# Patient Record
Sex: Male | Born: 1965 | Race: White | Hispanic: No | Smoking: Current every day smoker
Health system: Southern US, Community
[De-identification: ages and names within clinical notes are randomized; demographics above are authoritative.]

## PROBLEM LIST (undated history)

## (undated) HISTORY — PX: KNEE SURGERY: SHX244

---

## 2013-09-04 ENCOUNTER — Emergency Department (HOSPITAL_COMMUNITY): Payer: Medicaid - Out of State

## 2013-09-04 ENCOUNTER — Encounter (HOSPITAL_COMMUNITY): Payer: Self-pay | Admitting: Emergency Medicine

## 2013-09-04 ENCOUNTER — Emergency Department (HOSPITAL_COMMUNITY)
Admission: EM | Admit: 2013-09-04 | Discharge: 2013-09-04 | Discharge status: Against Medical Advice | Payer: Medicaid - Out of State | Attending: Emergency Medicine | Admitting: Emergency Medicine

## 2013-09-04 DIAGNOSIS — J40 Bronchitis, not specified as acute or chronic: Secondary | ICD-10-CM

## 2013-09-04 DIAGNOSIS — Z79899 Other long term (current) drug therapy: Secondary | ICD-10-CM | POA: Insufficient documentation

## 2013-09-04 DIAGNOSIS — F17200 Nicotine dependence, unspecified, uncomplicated: Secondary | ICD-10-CM | POA: Insufficient documentation

## 2013-09-04 DIAGNOSIS — J209 Acute bronchitis, unspecified: Secondary | ICD-10-CM | POA: Insufficient documentation

## 2013-09-04 DIAGNOSIS — R6883 Chills (without fever): Secondary | ICD-10-CM | POA: Insufficient documentation

## 2013-09-04 DIAGNOSIS — Z880 Allergy status to penicillin: Secondary | ICD-10-CM | POA: Insufficient documentation

## 2013-09-04 MED ORDER — DOXYCYCLINE HYCLATE 100 MG PO CAPS: 100.0000 mg | ORAL_CAPSULE | Freq: Two times a day (BID) | ORAL | Status: AC

## 2013-09-04 MED ORDER — ACETAMINOPHEN 500 MG PO TABS
1000.0000 mg | ORAL_TABLET | Freq: Once | ORAL | Status: AC
  Administered 2013-09-04: 1000 mg via ORAL
  Filled 2013-09-04: qty 2

## 2013-09-04 NOTE — ED Notes (Signed)
Patient questioning the wait and getting upset; updated patient on plan of care and that MD would be at bedside shortly.

## 2013-09-04 NOTE — ED Notes (Signed)
Patient very upset with his wait and states "i cannot wait much longer". Calmed and comforted patient and apologized for the delay. Dr. Hendricks MiloWoffard aware.

## 2013-09-04 NOTE — ED Notes (Signed)
Patient approached the nurses station yelling at staff stating "where's the front door, i'm not staying any longer." Patient stormed off before I could get patient to sign AMA. Dr. Hendricks MiloWoffard notified.

## 2013-09-04 NOTE — ED Notes (Signed)
Pt states cough, congestion and headache.  Started 2 weeks ago and patient took remaining amount of wife's antibiotics.  Pt felt better but infection came back.  Pt is in shelter and leaves each day outside until return.

## 2013-09-04 NOTE — ED Provider Notes (Signed)
CSN: [id number]     Arrival date & time 09/04/13  [id number] History   First MD Initiated Contact with Patient 09/04/13 1116     Chief Complaint  Patient presents with  . Cough  . Headache     (Consider location/radiation/quality/duration/timing/severity/associated sxs/prior Treatment) Patient is a 48 y.o. male presenting with cough.  Cough Cough characteristics:  Productive Sputum characteristics:  Yellow Severity:  Moderate Onset quality:  Gradual Duration:  2 weeks Timing:  Constant Progression:  Worsening Smoker: yes   Context: sick contacts (wife) and upper respiratory infection   Relieved by:  Nothing Ineffective treatments: "I took the rest of my wife's antibiotics last week" - does not know name of abx. Associated symptoms: chills, headaches, shortness of breath and sinus congestion   Associated symptoms: no fever   Associated symptoms comment:  No night sweats, no weight loss Headaches:    Severity:  Moderate (frontal)   Onset quality:  Gradual   Duration:  2 days   Timing:  Constant   Progression:  Unchanged   History reviewed. No pertinent past medical history. Past Surgical History  Procedure Laterality Date  . Knee surgery     History reviewed. No pertinent family history. History  Substance Use Topics  . Smoking status: Current Every Day Smoker -- 1.00 packs/day    Types: Cigarettes  . Smokeless tobacco: Not on file  . Alcohol Use: No    Review of Systems  Constitutional: Positive for chills. Negative for fever.  Respiratory: Positive for cough and shortness of breath.   Neurological: Positive for headaches.  All other systems reviewed and are negative.      Allergies  Penicillins  Home Medications   Current Outpatient Rx  Name  Route  Sig  Dispense  Refill  . Citalopram Hydrobromide (CELEXA PO)   Oral   Take 1 tablet by mouth daily.          BP 115/65  Pulse 98  Temp(Src) 98.7 F (37.1 C) (Oral)  Resp 18  SpO2 94% Physical Exam   Nursing note and vitals reviewed. Constitutional: He is oriented to person, place, and time. He appears well-developed and well-nourished. No distress.  HENT:  Head: Normocephalic and atraumatic.  Nose: Right sinus exhibits maxillary sinus tenderness and frontal sinus tenderness. Left sinus exhibits maxillary sinus tenderness and frontal sinus tenderness.  Mouth/Throat: Oropharynx is clear and moist.  Eyes: Conjunctivae are normal. Pupils are equal, round, and reactive to light. No scleral icterus.  Neck: Neck supple.  Cardiovascular: Normal rate, regular rhythm, normal heart sounds and intact distal pulses.   No murmur heard. Pulmonary/Chest: Effort normal and breath sounds normal. No stridor. No respiratory distress. He has no wheezes. He has no rales.  Abdominal: Soft. He exhibits no distension. There is no tenderness.  Musculoskeletal: Normal range of motion. He exhibits no edema.  Neurological: He is alert and oriented to person, place, and time.  Skin: Skin is warm and dry. No rash noted.  Psychiatric: He has a normal mood and affect. His behavior is normal.    ED Course  Procedures (including critical care time) Labs Review Labs Reviewed - No data to display Imaging Review Dg Chest 2 View  09/04/2013   CLINICAL DATA:  Cough and shortness of breath. Fever. Smoking history.  EXAM: CHEST  2 VIEW  COMPARISON:  None.  FINDINGS: Generous lung volumes, likely air trapping. There is mild bronchitic changes without edema or consolidation. No effusion or pneumothorax. Normal heart size.  IMPRESSION: Bronchitic changes without edema or consolidation.   Electronically Signed   By: Tiburcio Pea M.D.   On: 09/04/2013 10:50  All radiology studies independently viewed by me.     EKG Interpretation   None       MDM   Final diagnoses:  Bronchitis    48 yo male with URI symptoms with cough for past 2 weeks.  He also complains of headache, which sounds consistent with sinus headache.   No meningeal signs and pt afebrile.  CXR negative for pneumonia.  He was ambulated with normal respiratory effort and O2 sats.  Planned to treat patient with abx for bronchitis, but pt eloped from ED prior to receiving prescriptions.  I tried to call pt, but number provided did not go through on multiple attempts.  Will print prescription and leave for patient in case he returns for prescription.    Candyce Churn III, MD 09/04/13 [phone]

## 2013-09-04 NOTE — ED Notes (Signed)
Patient refusing to put on patient gown for MD examination.

## 2013-09-04 NOTE — Discharge Instructions (Signed)

## 2015-07-02 IMAGING — CR DG CHEST 2V
2 series · 2 of 2 positions shown · non-contrast
Comparison: None.

CLINICAL DATA: Cough and shortness of breath. Fever. Smoking
history.

EXAM:
CHEST  2 VIEW

[w chest pa]
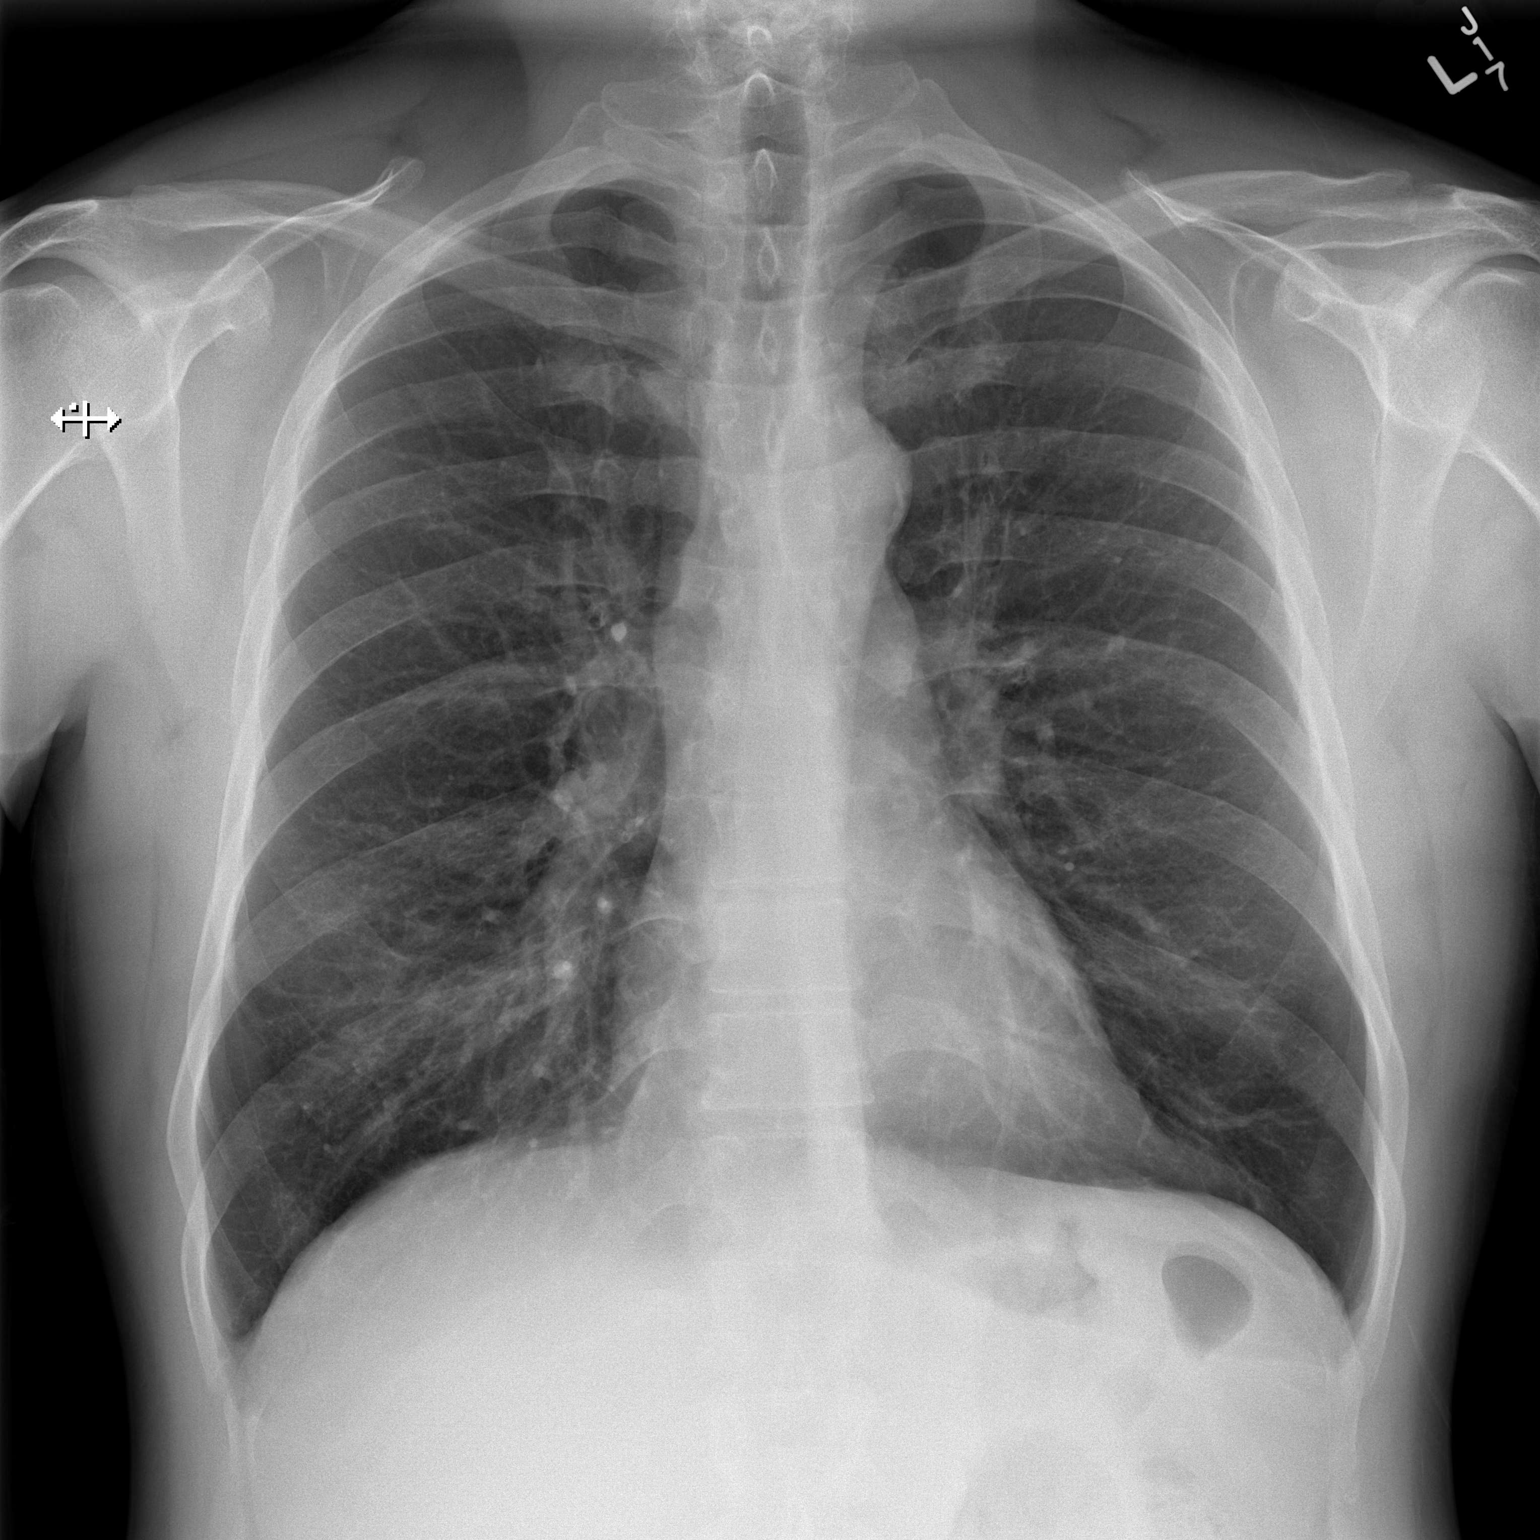

[w chest lat]
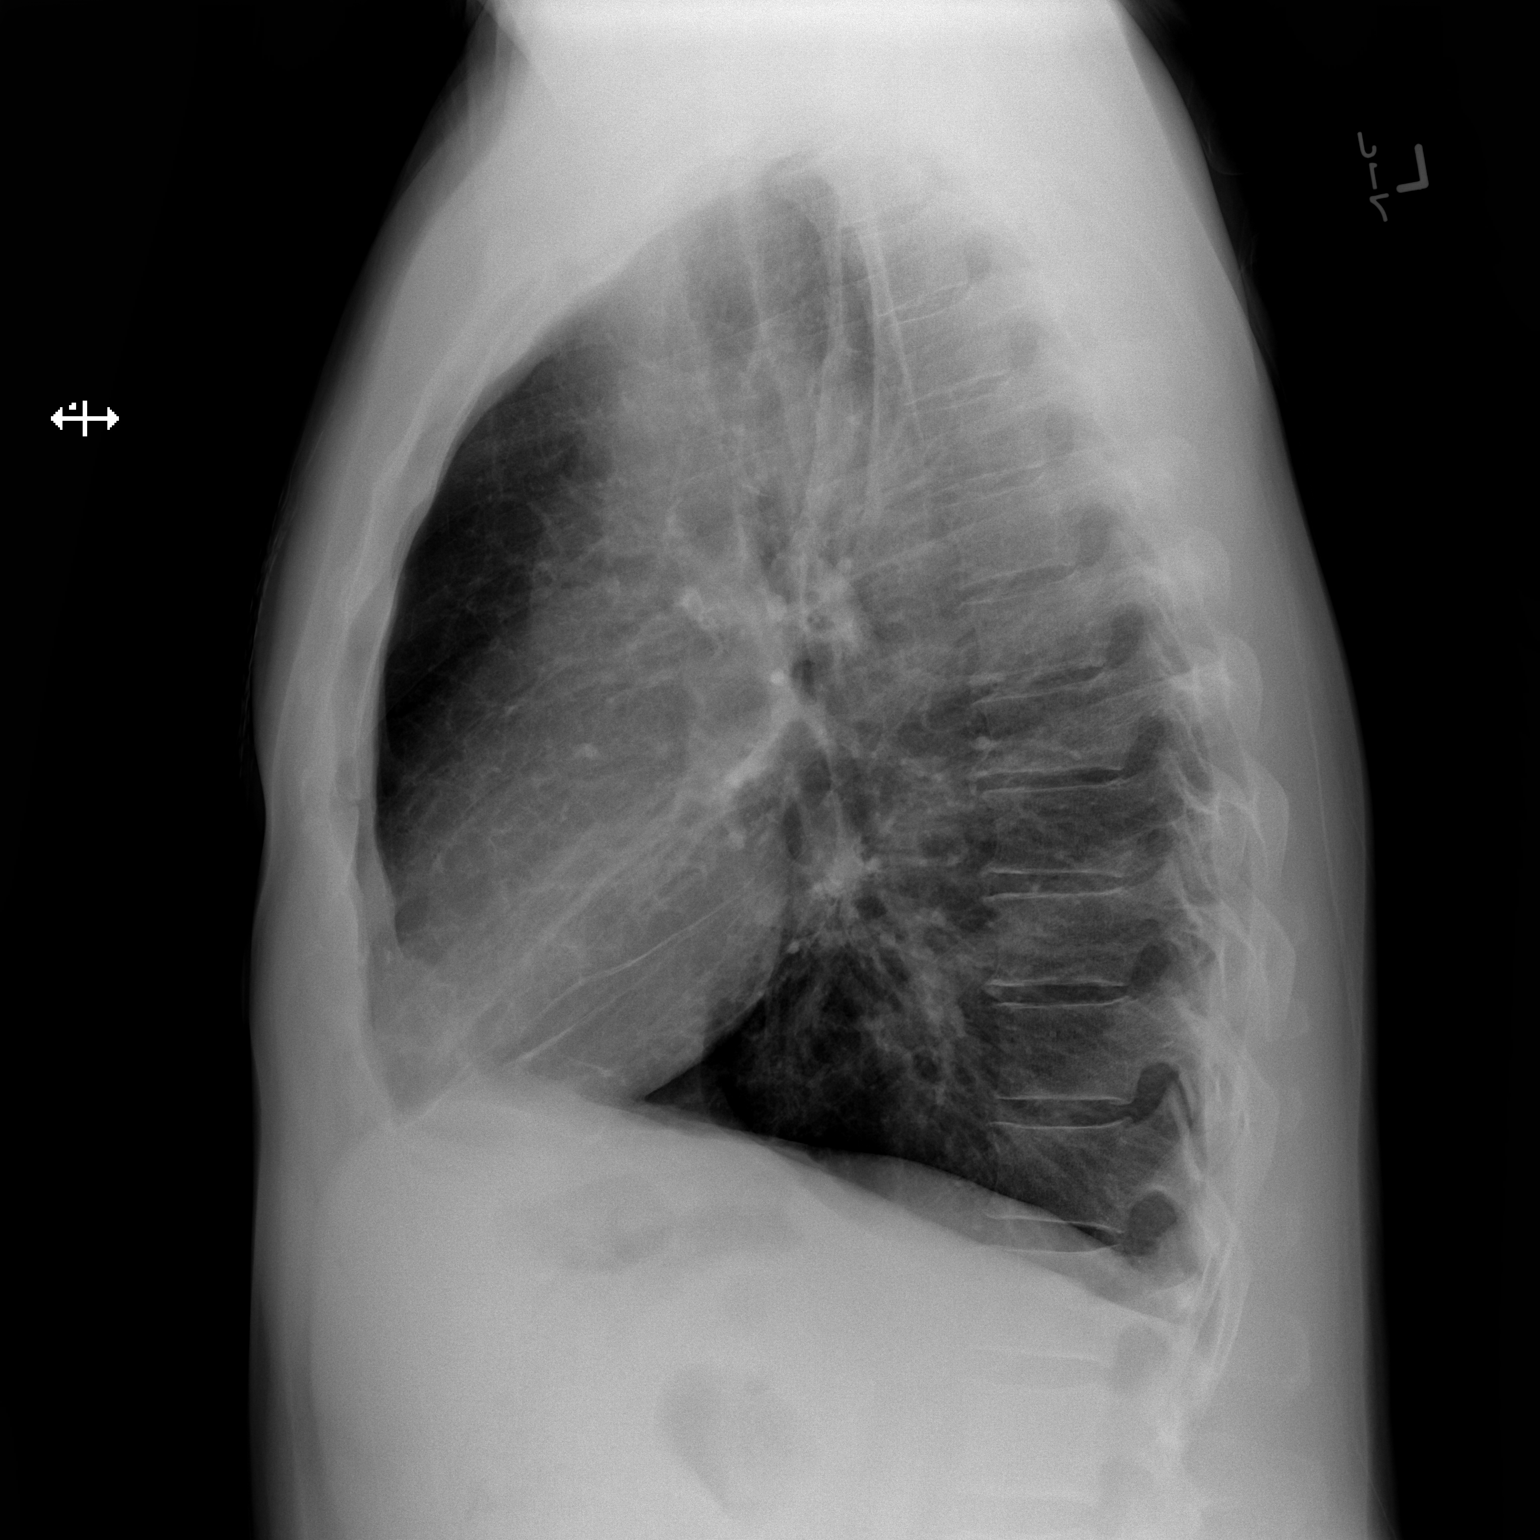

[2 of 2 positions shown; findings below may reference images not displayed]

FINDINGS: Generous lung volumes, likely air trapping. There is mild bronchitic
changes without edema or consolidation. No effusion or pneumothorax.
Normal heart size.
IMPRESSION: Bronchitic changes without edema or consolidation.
